# Patient Record
Sex: Male | Born: 1955 | Race: Black or African American | Hispanic: No | Marital: Married
Health system: Southern US, Community
[De-identification: ages and names within clinical notes are randomized; demographics above are authoritative.]

## PROBLEM LIST (undated history)

## (undated) ENCOUNTER — Emergency Department (HOSPITAL_COMMUNITY)

## (undated) DIAGNOSIS — J449 Chronic obstructive pulmonary disease, unspecified: Secondary | ICD-10-CM

---

## 1999-03-17 ENCOUNTER — Emergency Department (HOSPITAL_COMMUNITY): Admission: EM | Admit: 1999-03-17 | Discharge: 1999-03-17 | Payer: Self-pay

## 2002-03-01 ENCOUNTER — Encounter: Payer: Self-pay | Admitting: Emergency Medicine

## 2002-03-01 ENCOUNTER — Emergency Department (HOSPITAL_COMMUNITY): Admission: EM | Admit: 2002-03-01 | Discharge: 2002-03-01 | Payer: Self-pay | Admitting: Emergency Medicine

## 2002-03-10 ENCOUNTER — Encounter: Payer: Self-pay | Admitting: Surgery

## 2002-03-12 ENCOUNTER — Encounter: Payer: Self-pay | Admitting: Surgery

## 2002-03-12 ENCOUNTER — Inpatient Hospital Stay (HOSPITAL_COMMUNITY): Admission: RE | Admit: 2002-03-12 | Discharge: 2002-03-15 | Payer: Self-pay | Admitting: Surgery

## 2002-03-13 ENCOUNTER — Encounter: Payer: Self-pay | Admitting: Surgery

## 2002-03-14 ENCOUNTER — Encounter: Payer: Self-pay | Admitting: Surgery

## 2002-04-08 ENCOUNTER — Encounter: Admission: RE | Admit: 2002-04-08 | Discharge: 2002-04-08 | Payer: Self-pay | Admitting: Surgery

## 2002-04-08 ENCOUNTER — Encounter: Payer: Self-pay | Admitting: Surgery

## 2002-09-08 ENCOUNTER — Encounter: Payer: Self-pay | Admitting: Surgery

## 2002-09-08 ENCOUNTER — Encounter: Admission: RE | Admit: 2002-09-08 | Discharge: 2002-09-08 | Payer: Self-pay | Admitting: Surgery

## 2003-12-17 ENCOUNTER — Ambulatory Visit (HOSPITAL_COMMUNITY): Admission: RE | Admit: 2003-12-17 | Discharge: 2003-12-17 | Payer: Self-pay | Admitting: Neurology

## 2005-02-14 ENCOUNTER — Encounter: Admission: RE | Admit: 2005-02-14 | Discharge: 2005-02-14 | Payer: Self-pay | Admitting: Surgery

## 2005-11-30 IMAGING — CT CT CHEST W/ CM
1 of 2 series · 14 of 30 positions shown, 18 images · IV contrast (75 ML OMNI 300)
Comparison: CT chest 12/17/2003 and 03/01/2002.

CLINICAL DATA: History of thymoma, resected in 9998. Routine followup.

CT CHEST WITH CONTRAST  02/14/2005:
TECHNIQUE: Multidetector CT imaging of the chest and upper abdomen was
performed during bolus administration of intravenous contrast.
Contrast:  75 cc Omnipaque 300.

[Series 2: — · axial · 0.70mm/px · z∈[-302,-36]mm · 14 of 63 slices shown, 18 images]
[im 5/63  mediastinal]
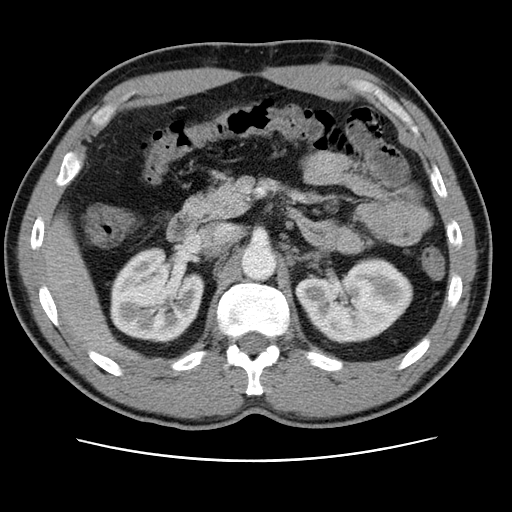
[im 5/63  lung]
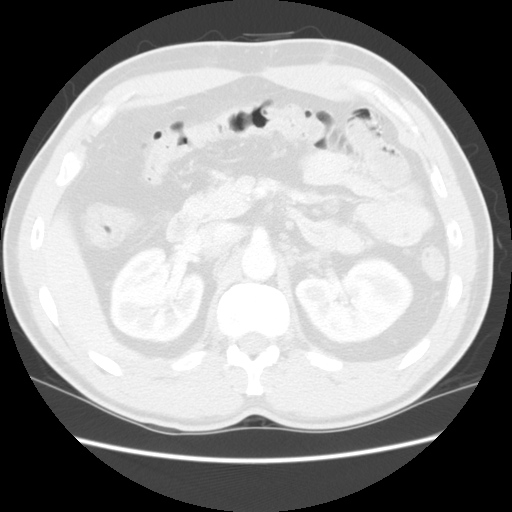
[im 9/63  lung]
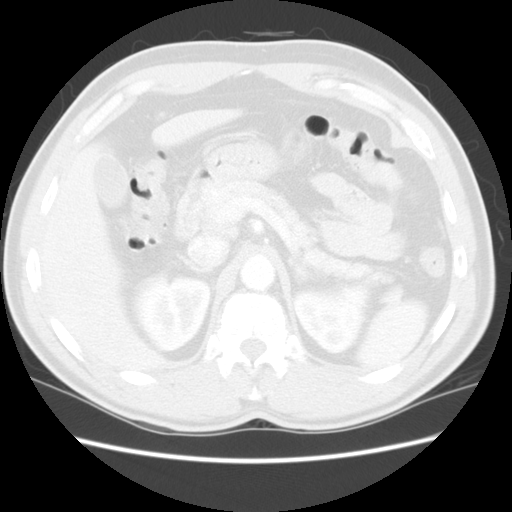
[im 14/63  lung]
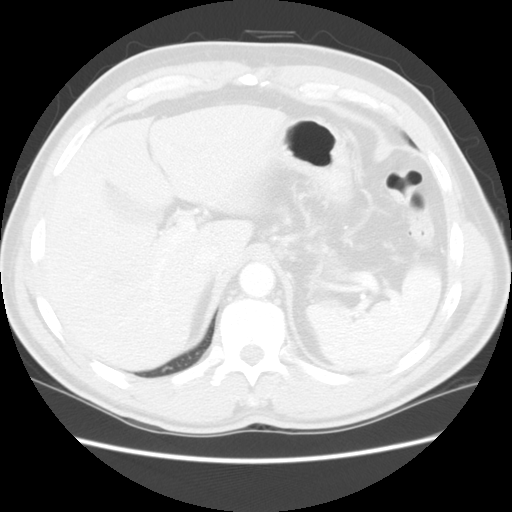
[im 18/63  lung]
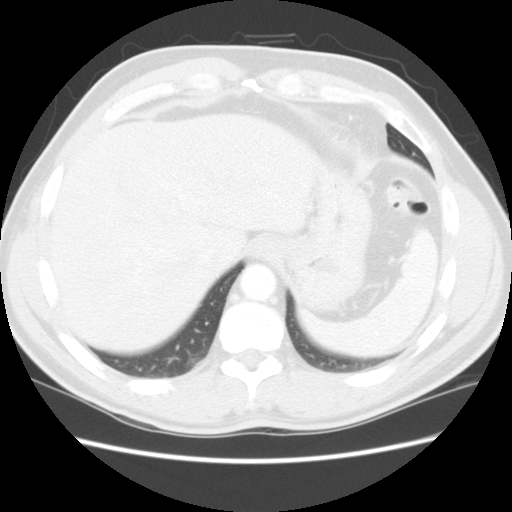
[im 23/63  mediastinal]
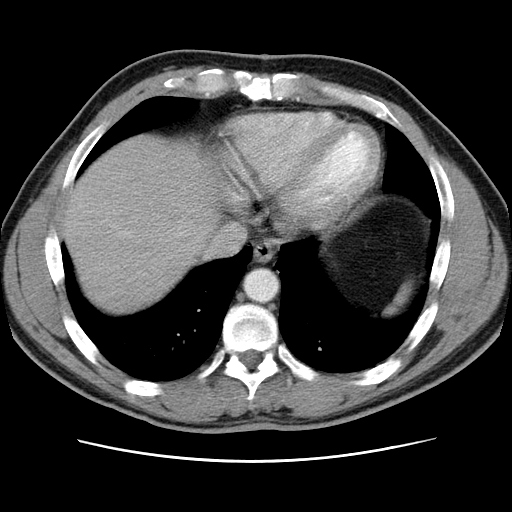
[im 23/63  lung]
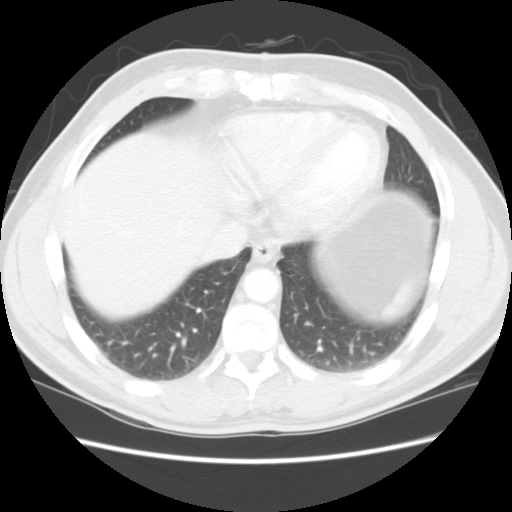
[im 27/63  lung]
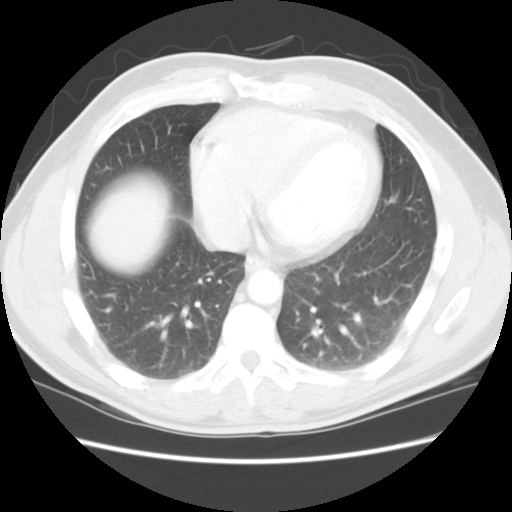
[im 30/63  lung]
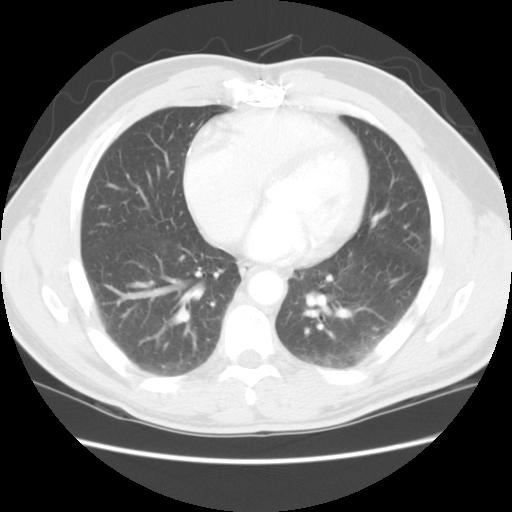
[im 32/63  lung]
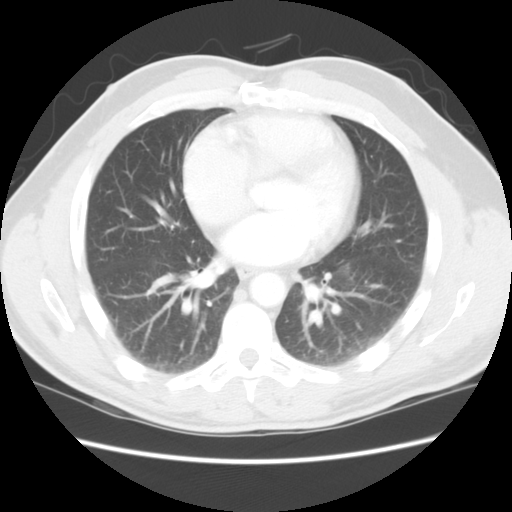
[im 36/63  mediastinal]
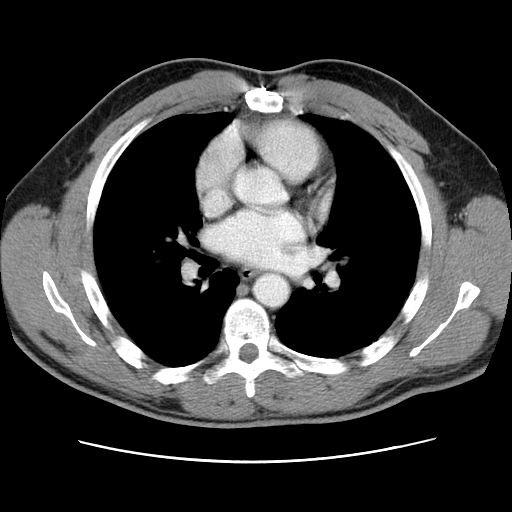
[im 36/63  lung]
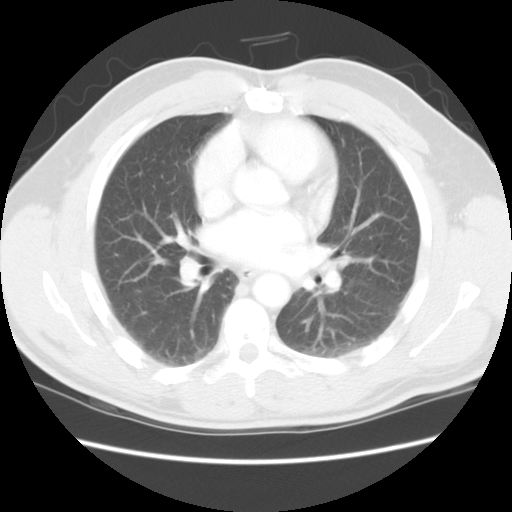
[im 40/63  lung]
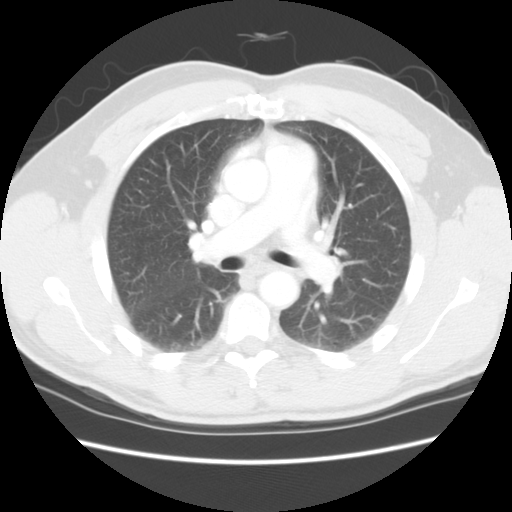
[im 45/63  lung]
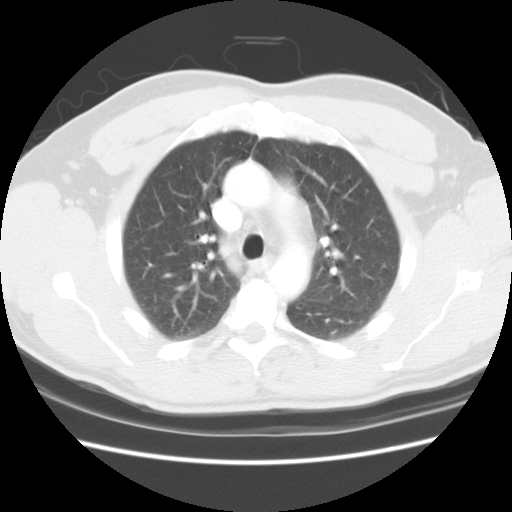
[im 49/63  lung]
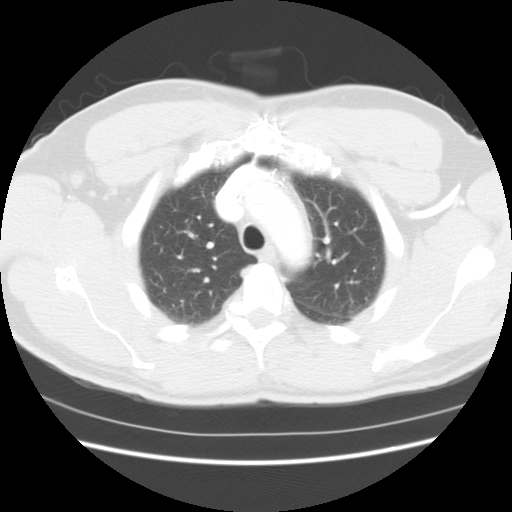
[im 54/63  mediastinal]
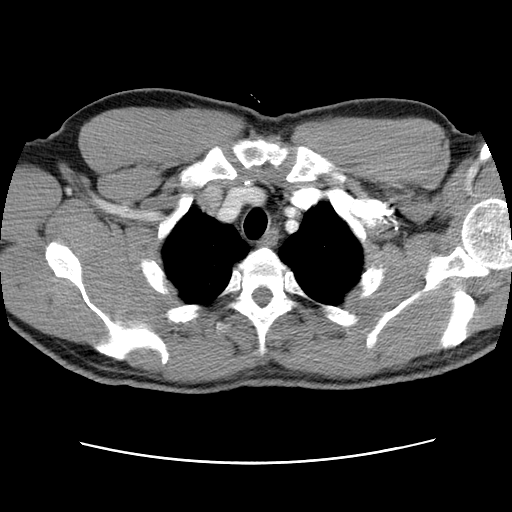
[im 54/63  lung]
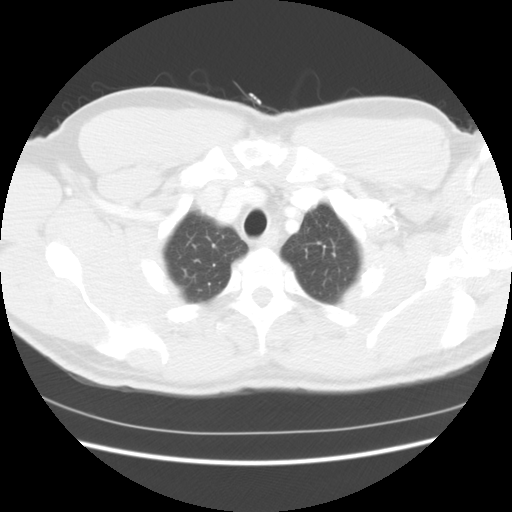
[im 58/63  lung]
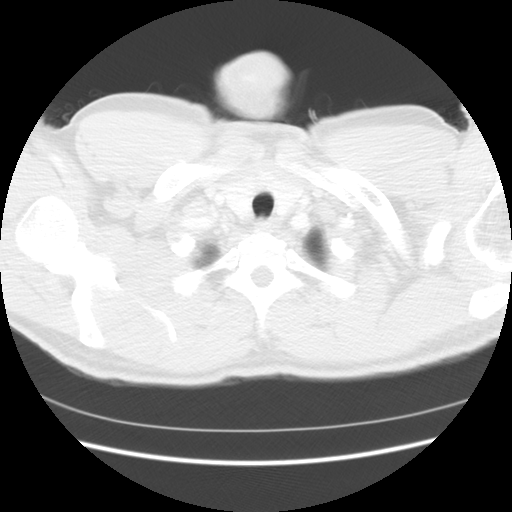

[14 of 30 positions shown; findings below may reference images not displayed]

FINDINGS: There is no evidence of recurrent thymoma in the anterior
mediastinum. No mediastinal masses or lymphadenopathy are identified. There is
no hilar or axillary lymphadenopathy. The heart size is normal and is stable.
There is no pericardial effusion. There are no pleural effusions.

No pulmonary parenchymal nodules are identified to suggest metastatic disease.
The pulmonary parenchyma is clear. The visualized upper abdomen is unremarkable
apart from scattered colonic diverticula. Review of bone window images shows no
evidence of metastatic disease.
IMPRESSION: 1. No evidence of recurrent thymoma or metastatic disease in the chest or upper
abdomen.

2. No acute cardiopulmonary disease.

## 2019-12-04 ENCOUNTER — Other Ambulatory Visit: Payer: Self-pay

## 2019-12-04 ENCOUNTER — Emergency Department (HOSPITAL_COMMUNITY): Payer: No Typology Code available for payment source

## 2019-12-04 ENCOUNTER — Emergency Department (HOSPITAL_COMMUNITY)
Admission: EM | Admit: 2019-12-04 | Discharge: 2019-12-04 | Disposition: A | Discharge status: Home / Self Care | Payer: No Typology Code available for payment source | Attending: Emergency Medicine | Admitting: Emergency Medicine

## 2019-12-04 ENCOUNTER — Encounter (HOSPITAL_COMMUNITY): Payer: Self-pay | Admitting: Emergency Medicine

## 2019-12-04 DIAGNOSIS — R05 Cough: Secondary | ICD-10-CM | POA: Insufficient documentation

## 2019-12-04 DIAGNOSIS — Z20822 Contact with and (suspected) exposure to covid-19: Secondary | ICD-10-CM | POA: Insufficient documentation

## 2019-12-04 DIAGNOSIS — R5383 Other fatigue: Secondary | ICD-10-CM | POA: Insufficient documentation

## 2019-12-04 DIAGNOSIS — J441 Chronic obstructive pulmonary disease with (acute) exacerbation: Secondary | ICD-10-CM | POA: Diagnosis not present

## 2019-12-04 DIAGNOSIS — R0602 Shortness of breath: Secondary | ICD-10-CM | POA: Diagnosis present

## 2019-12-04 HISTORY — DX: Chronic obstructive pulmonary disease, unspecified: J44.9

## 2019-12-04 MED ORDER — ALBUTEROL SULFATE HFA 108 (90 BASE) MCG/ACT IN AERS
4.0000 | INHALATION_SPRAY | RESPIRATORY_TRACT | Status: DC | PRN
  Administered 2019-12-04: 13:00:00 4 via RESPIRATORY_TRACT
  Filled 2019-12-04: qty 6.7

## 2019-12-04 MED ORDER — PREDNISONE 20 MG PO TABS: 60.0000 mg | ORAL_TABLET | Freq: Every day | ORAL | 0 refills | Status: DC

## 2019-12-04 NOTE — ED Provider Notes (Signed)
[facility]-EMERGENCY DEPT Provider Note   CSN: [id number] Arrival date & time: 12/04/19  1234     History Chief Complaint  Patient presents with  . Shortness of Breath    Carl Hopkins is a 64 y.o. male.  HPI Patient presents with shortness of breath and cough.  Has had for last few days.  Reportedly was seen around 6 days ago at the [place] for similar symptoms.  Was given doxycycline and cough medicines.  Has really had little relief.  History of COPD.  States he has had negative Covid test.  Including on Friday.  Has had some clear sputum production.  No fevers or chills.  Has had some mild fatigue.  No chest pain.  No known sick contacts or states he has been around people.    Past Medical History:  Diagnosis Date  . COPD (chronic obstructive pulmonary disease) (HCC)     There are no problems to display for this patient.   History reviewed. No pertinent surgical history.     No family history on file.  Social History   Tobacco Use  . Smoking status: Not on file  Substance Use Topics  . Alcohol use: Not on file  . Drug use: Not on file    Home Medications Prior to Admission medications   Medication Sig Start Date End Date Taking? Authorizing Provider  predniSONE (DELTASONE) 20 MG tablet Take 3 tablets (60 mg total) by mouth daily. 12/04/19   Benjiman Core, MD    Allergies    Patient has no known allergies.  Review of Systems   Review of Systems  Constitutional: Negative for appetite change.  HENT: Negative for congestion.   Respiratory: Positive for cough, shortness of breath and wheezing.   Gastrointestinal: Negative for abdominal pain.  Genitourinary: Negative for flank pain.  Musculoskeletal: Negative for back pain.  Skin: Negative for rash.  Neurological: Negative for weakness.  Psychiatric/Behavioral: Negative for confusion.    Physical Exam Updated Vital Signs BP 133/87   Pulse 89   Temp (!) 97.3 F (36.3 C)    Resp 18   SpO2 93%   Physical Exam Nursing note reviewed.  HENT:     Head: Normocephalic.  Cardiovascular:     Rate and Rhythm: Normal rate and regular rhythm.  Pulmonary:     Breath sounds: Wheezing present. No decreased breath sounds.     Comments: Somewhat dyspneic.  Wheezing.  Completing full sentences however Chest:     Chest wall: No tenderness.  Musculoskeletal:     Cervical back: Neck supple.     Right lower leg: No edema.     Left lower leg: No edema.  Skin:    General: Skin is warm.     Capillary Refill: Capillary refill takes less than 2 seconds.  Neurological:     Mental Status: He is alert and oriented to person, place, and time.     ED Results / Procedures / Treatments   Labs (all labs ordered are listed, but only abnormal results are displayed) Labs Reviewed  NOVEL CORONAVIRUS, NAA (HOSP ORDER, SEND-OUT TO REF LAB; TAT 18-24 HRS)    EKG None  Radiology DG Chest Portable 1 View  Result Date: 12/04/2019 CLINICAL DATA:  Should shortness of breath. Cough and chest pain EXAM: PORTABLE CHEST 1 VIEW COMPARISON:  Remote chest CT 02/14/2005 FINDINGS: Post median sternotomy. The cardiomediastinal contours are normal. Minimal streaky retrocardiac opacities pulmonary vasculature is normal. No consolidation, pleural effusion, or pneumothorax.  No acute osseous abnormalities are seen. IMPRESSION: Streaky retrocardiac opacities, favoring atelectasis. Electronically Signed   By: Keith Rake M.D.   On: 12/04/2019 14:26    Procedures Procedures (including critical care time)  Medications Ordered in ED Medications  albuterol (VENTOLIN HFA) 108 (90 Base) MCG/ACT inhaler 4 puff (4 puffs Inhalation Given 12/04/19 1329)    ED Course  I have reviewed the triage vital signs and the nursing notes.  Pertinent labs & imaging results that were available during my care of the patient were reviewed by me and considered in my medical decision making (see chart for details).     MDM Rules/Calculators/A&P                      Patient with shortness of breath.  Not hypoxic here even with ambulation.  Had recently been on doxycycline by [place] PCP.  Had not been on steroids.  Feels better after 4 puffs of albuterol.  Will treat with steroids.  Also will recheck an outpatient Covid test.  However not hypoxic and appears stable for discharge. Final Clinical Impression(s) / ED Diagnoses Final diagnoses:  COPD exacerbation ([place])    Rx / DC Orders ED Discharge Orders         Ordered    predniSONE (DELTASONE) 20 MG tablet  Daily,   Status:  Discontinued     12/04/19 1449    predniSONE (DELTASONE) 20 MG tablet  Daily     12/04/19 1451           Davonna Belling, MD 12/04/19 1526

## 2019-12-04 NOTE — ED Triage Notes (Signed)
Patient reports SOB with cough and chest pains x3 days. Hx COPD. Reports recent negative Covid test. Wheezing in triage.

## 2019-12-05 LAB — NOVEL CORONAVIRUS, NAA (HOSP ORDER, SEND-OUT TO REF LAB; TAT 18-24 HRS): SARS-CoV-2, NAA: NOT DETECTED

## 2020-09-18 IMAGING — DX DG CHEST 1V PORT
1 series · 1 of 1 positions shown · non-contrast
Comparison: Remote chest CT 02/14/2005

CLINICAL DATA: Should shortness of breath. Cough and chest pain

EXAM:
PORTABLE CHEST 1 VIEW

[chest ap]
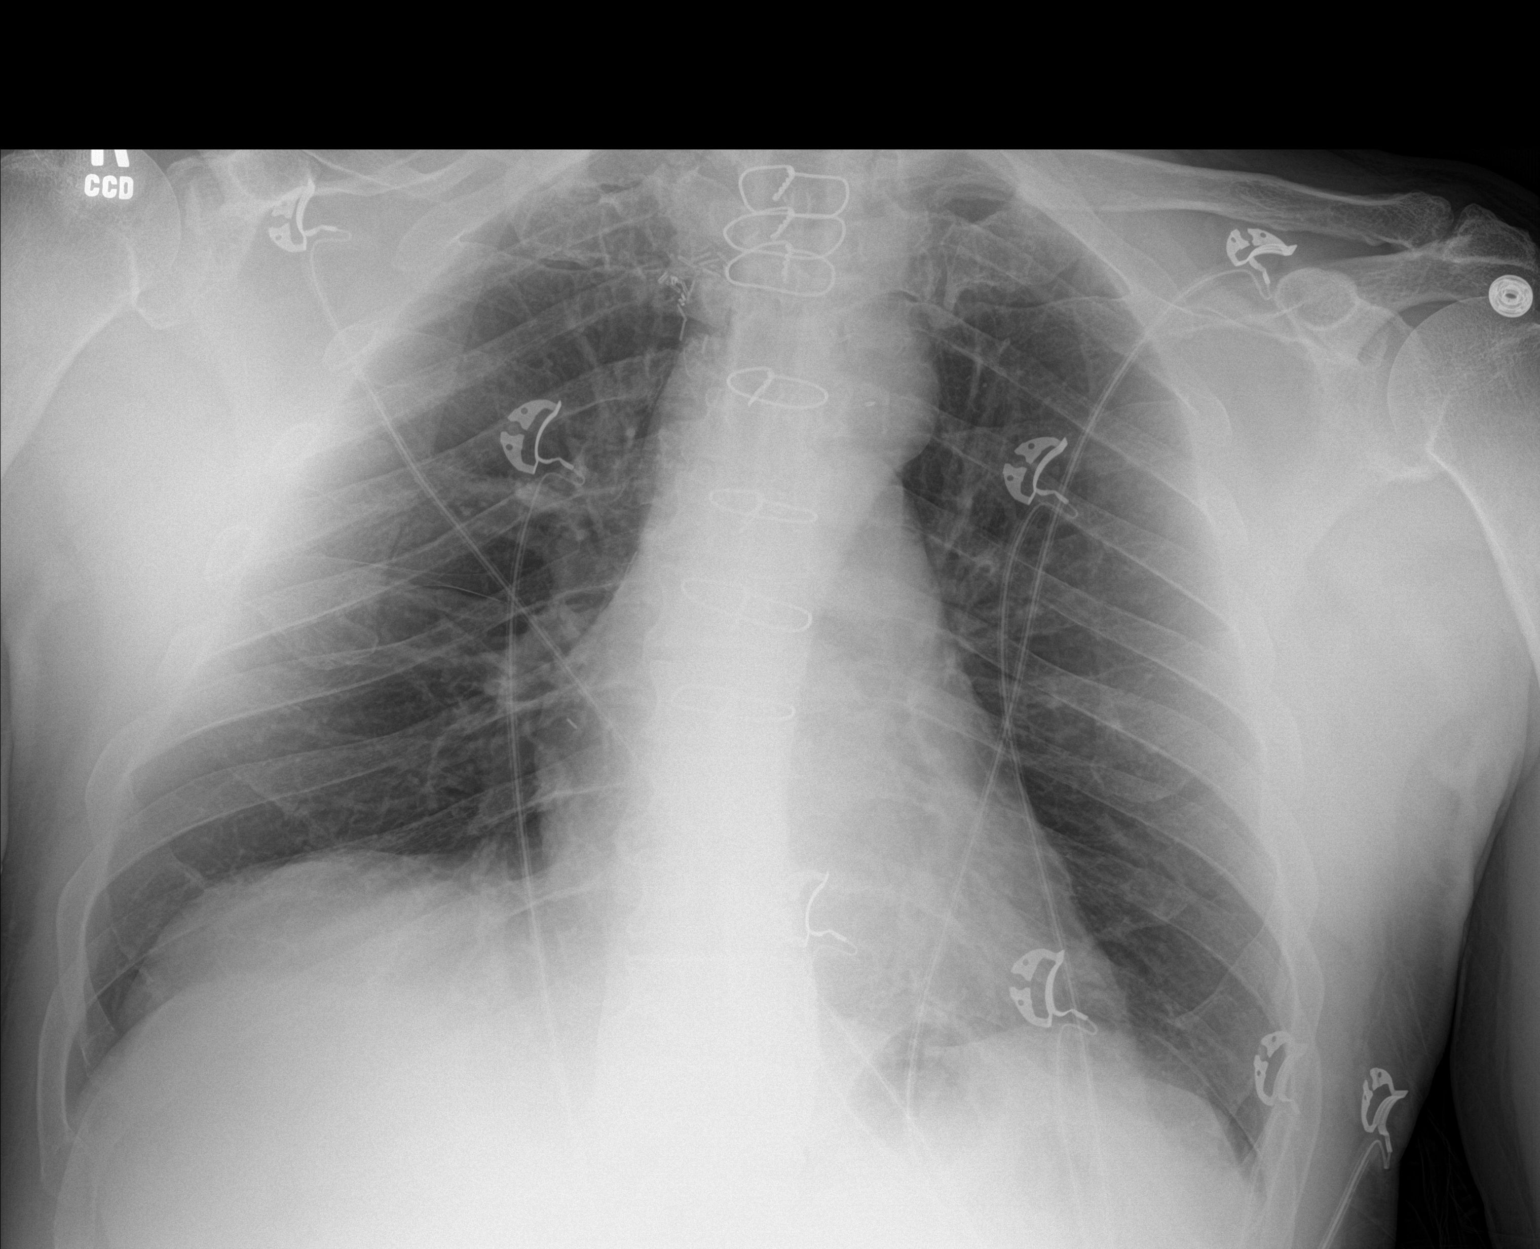

[1 of 1 positions shown; findings below may reference images not displayed]

FINDINGS: Post median sternotomy. The cardiomediastinal contours are normal.
Minimal streaky retrocardiac opacities pulmonary vasculature is
normal. No consolidation, pleural effusion, or pneumothorax. No
acute osseous abnormalities are seen.
IMPRESSION: Streaky retrocardiac opacities, favoring atelectasis.

## 2023-07-31 ENCOUNTER — Emergency Department (HOSPITAL_COMMUNITY)
Admission: EM | Admit: 2023-07-31 | Discharge: 2023-07-31 | Disposition: A | Discharge status: Home / Self Care | Payer: No Typology Code available for payment source | Attending: Emergency Medicine | Admitting: Emergency Medicine

## 2023-07-31 ENCOUNTER — Other Ambulatory Visit: Payer: Self-pay

## 2023-07-31 ENCOUNTER — Emergency Department (HOSPITAL_COMMUNITY): Payer: No Typology Code available for payment source

## 2023-07-31 DIAGNOSIS — R0602 Shortness of breath: Secondary | ICD-10-CM | POA: Diagnosis present

## 2023-07-31 DIAGNOSIS — J441 Chronic obstructive pulmonary disease with (acute) exacerbation: Secondary | ICD-10-CM | POA: Insufficient documentation

## 2023-07-31 DIAGNOSIS — Z7951 Long term (current) use of inhaled steroids: Secondary | ICD-10-CM | POA: Insufficient documentation

## 2023-07-31 DIAGNOSIS — Z7952 Long term (current) use of systemic steroids: Secondary | ICD-10-CM | POA: Diagnosis not present

## 2023-07-31 LAB — CBC
HCT: 48.3 % (ref 39.0–52.0)
Hemoglobin: 16 g/dL (ref 13.0–17.0)
MCH: 31.2 pg (ref 26.0–34.0)
MCHC: 33.1 g/dL (ref 30.0–36.0)
MCV: 94.2 fL (ref 80.0–100.0)
Platelets: 314 10*3/uL (ref 150–400)
RBC: 5.13 MIL/uL (ref 4.22–5.81)
RDW: 14.3 % (ref 11.5–15.5)
WBC: 5.7 10*3/uL (ref 4.0–10.5)
nRBC: 0 % (ref 0.0–0.2)

## 2023-07-31 LAB — BASIC METABOLIC PANEL
Anion gap: 16 — ABNORMAL HIGH (ref 5–15)
BUN: 11 mg/dL (ref 8–23)
CO2: 25 mmol/L (ref 22–32)
Calcium: 9.1 mg/dL (ref 8.9–10.3)
Chloride: 101 mmol/L (ref 98–111)
Creatinine, Ser: 0.94 mg/dL (ref 0.61–1.24)
GFR, Estimated: >60 mL/min (ref >60)
Glucose, Bld: 103 mg/dL — ABNORMAL HIGH (ref 70–99)
Potassium: 3.8 mmol/L (ref 3.5–5.1)
Sodium: 142 mmol/L (ref 135–145)

## 2023-07-31 LAB — TROPONIN I (HIGH SENSITIVITY)
Troponin I (High Sensitivity): 17 ng/L (ref <18)
Troponin I (High Sensitivity): 16 ng/L (ref <18)

## 2023-07-31 MED ORDER — PREDNISONE 10 MG PO TABS: ORAL_TABLET | ORAL | 0 refills | Status: AC

## 2023-07-31 MED ORDER — METHYLPREDNISOLONE SODIUM SUCC 125 MG IJ SOLR
125.0000 mg | Freq: Once | INTRAMUSCULAR | Status: AC
  Administered 2023-07-31: 125 mg via INTRAVENOUS
  Filled 2023-07-31: qty 2

## 2023-07-31 MED ORDER — ALBUTEROL SULFATE HFA 108 (90 BASE) MCG/ACT IN AERS: 1.0000 | INHALATION_SPRAY | Freq: Four times a day (QID) | RESPIRATORY_TRACT | 0 refills | Status: AC | PRN

## 2023-07-31 MED ORDER — ALBUTEROL SULFATE (2.5 MG/3ML) 0.083% IN NEBU
10.0000 mg/h | INHALATION_SOLUTION | Freq: Once | RESPIRATORY_TRACT | Status: AC
  Administered 2023-07-31: 10 mg/h via RESPIRATORY_TRACT
  Filled 2023-07-31: qty 12

## 2023-07-31 MED ORDER — IPRATROPIUM-ALBUTEROL 0.5-2.5 (3) MG/3ML IN SOLN
3.0000 mL | Freq: Once | RESPIRATORY_TRACT | Status: AC
  Administered 2023-07-31: 3 mL via RESPIRATORY_TRACT
  Filled 2023-07-31: qty 3

## 2023-07-31 NOTE — ED Triage Notes (Signed)
Patient arrives for eval of sob and CP only when coughing since last night. Productive cough x 3 days. Audible wheezing in triage.

## 2023-07-31 NOTE — Discharge Instructions (Addendum)
Use the inhaler and the steroid as prescribed.  Follow-up with your doctor to be rechecked to make sure you are improving.  Return to the ED for any worsening symptoms

## 2023-07-31 NOTE — ED Provider Notes (Signed)
[place] EMERGENCY DEPARTMENT AT [facility] Provider Note   CSN: [id number] Arrival date & time: 07/31/23  1053     History  Chief Complaint  Patient presents with   Shortness of Breath   Chest Pain    Carl Hopkins is a 67 y.o. male.   Shortness of Breath Associated symptoms: chest pain   Chest Pain Associated symptoms: shortness of breath      Patient has a history of COPD.  He presents to the ED with complaints of shortness of breath chest congestion.  Patient states he has been having some cough and congestion ongoing for over a month.  However in the last few days he has had a more productive cough.  He has been feeling short of breath with activity.  He has had some chest discomfort and pain when coughing.  Those symptoms started last night.  Patient has noticed increasing wheezing recently.  He has not had any fevers.  No leg swelling.  Home Medications Prior to Admission medications   Medication Sig Start Date End Date Taking? Authorizing Provider  albuterol (VENTOLIN HFA) 108 (90 Base) MCG/ACT inhaler Inhale 1-2 puffs into the lungs every 6 (six) hours as needed for wheezing or shortness of breath. 07/31/23  Yes Linwood Dibbles, MD  escitalopram (LEXAPRO) 10 MG tablet Take 5 mg by mouth daily. 05/31/23  Yes [provider]  fluticasone (FLONASE) 50 MCG/ACT nasal spray Place 1 spray into both nostrils daily. 06/19/23  Yes [provider]  fluticasone-salmeterol (WIXELA INHUB) 250-50 MCG/ACT AEPB Inhale 1 puff into the lungs in the morning and at bedtime.   Yes [provider]  losartan (COZAAR) 25 MG tablet Take 25 mg by mouth daily.   Yes [provider]  montelukast (SINGULAIR) 10 MG tablet Take 1 tablet by mouth daily. 03/31/22  Yes [provider]  omeprazole (PRILOSEC) 40 MG capsule Take 40 mg by mouth daily. 07/04/22  Yes [provider]  prazosin (MINIPRESS) 1 MG capsule Take 1 mg by mouth at  bedtime. 05/31/23  Yes [provider]  predniSONE (DELTASONE) 10 MG tablet Take 6 tablets (60 mg total) by mouth daily with breakfast for 2 days, THEN 5 tablets (50 mg total) daily with breakfast for 2 days, THEN 4 tablets (40 mg total) daily with breakfast for 2 days, THEN 3 tablets (30 mg total) daily with breakfast for 2 days, THEN 2 tablets (20 mg total) daily with breakfast for 2 days, THEN 1 tablet (10 mg total) daily with breakfast for 2 days. 07/31/23 08/11/23 Yes Linwood Dibbles, MD      Allergies    Patient has no known allergies.    Review of Systems   Review of Systems  Respiratory:  Positive for shortness of breath.   Cardiovascular:  Positive for chest pain.    Physical Exam Updated Vital Signs BP (!) 137/90   Pulse 93   Temp 97.9 F (36.6 C) (Oral)   Resp (!) 21   Ht 1.715 m (5' 7.5")   Wt 89.8 kg   SpO2 96%   BMI 30.55 kg/m  Physical Exam Vitals and nursing note reviewed.  Constitutional:      Appearance: He is well-developed. He is not ill-appearing.  HENT:     Head: Normocephalic and atraumatic.     Right Ear: External ear normal.     Left Ear: External ear normal.  Eyes:     General: No scleral icterus.  Right eye: No discharge.        Left eye: No discharge.     Conjunctiva/sclera: Conjunctivae normal.  Neck:     Trachea: No tracheal deviation.  Cardiovascular:     Rate and Rhythm: Normal rate and regular rhythm.  Pulmonary:     Effort: Pulmonary effort is normal. No respiratory distress.     Breath sounds: No stridor. Wheezing present. No rales.  Abdominal:     General: Bowel sounds are normal. There is no distension.     Palpations: Abdomen is soft.     Tenderness: There is no abdominal tenderness. There is no guarding or rebound.  Musculoskeletal:        General: No tenderness or deformity.     Cervical back: Neck supple.     Right lower leg: No edema.     Left lower leg: No edema.  Skin:    General: Skin is warm and dry.      Findings: No rash.  Neurological:     General: No focal deficit present.     Mental Status: He is alert.     Cranial Nerves: No cranial nerve deficit, dysarthria or facial asymmetry.     Sensory: No sensory deficit.     Motor: No abnormal muscle tone or seizure activity.     Coordination: Coordination normal.  Psychiatric:        Mood and Affect: Mood normal.     ED Results / Procedures / Treatments   Labs (all labs ordered are listed, but only abnormal results are displayed) Labs Reviewed  BASIC METABOLIC PANEL - Abnormal; Notable for the following components:      Result Value   Glucose, Bld 103 (*)    Anion gap 16 (*)    All other components within normal limits  CBC  TROPONIN I (HIGH SENSITIVITY)  TROPONIN I (HIGH SENSITIVITY)    EKG EKG Interpretation Date/Time:  Tuesday July 31 2023 10:46:34 EDT Ventricular Rate:  92 PR Interval:  144 QRS Duration:  74 QT Interval:  362 QTC Calculation: 447 R Axis:   90  Text Interpretation: Normal sinus rhythm Rightward axis Borderline ECG When compared with ECG of 04-Dec-2019 12:46, No significant change since last tracing Confirmed by Linwood Dibbles [phone]) on 07/31/2023 2:33:20 PM  Radiology DG Chest 2 View  Result Date: 07/31/2023 CLINICAL DATA:  Chest pain and shortness of breath for 1 day. Fatigue. COPD. EXAM: CHEST - 2 VIEW COMPARISON:  12/04/2019 FINDINGS: The heart size and mediastinal contours are within normal limits. Prior median sternotomy again noted. Pulmonary hyperinflation again seen, consistent with COPD. Both lungs are clear. Stable mild thoracic dextroscoliosis. IMPRESSION: COPD. No active cardiopulmonary disease. Electronically Signed   By: Danae Orleans M.D.   On: 07/31/2023 12:50    Procedures Procedures    Medications Ordered in ED Medications  ipratropium-albuterol (DUONEB) 0.5-2.5 (3) MG/3ML nebulizer solution 3 mL (3 mLs Nebulization Given 07/31/23 1119)  methylPREDNISolone sodium succinate  (SOLU-MEDROL) 125 mg/2 mL injection 125 mg (125 mg Intravenous Given 07/31/23 1514)  albuterol (PROVENTIL) (2.5 MG/3ML) 0.083% nebulizer solution (10 mg/hr Nebulization Given 07/31/23 1504)    ED Course/ Medical Decision Making/ A&P Clinical Course as of 07/31/23 1732  Tue Jul 31, 2023  1448 CBC CBC metabolic panel unremarkable.  Troponin normal [JK]  1448 DG Chest 2 View Chest x-ray shows COPD no other acute findings [JK]  1635 Serial troponins normal [JK]  1727 Patient still has some wheezing on exam but is  feeling much better.  He was able to ambulate around the ED without any oxygen desaturation [JK]    Clinical Course User Index [JK] Linwood Dibbles, MD                                 Medical Decision Making Differential diagnosis includes but not limited to CHF exacerbation pneumonia, COPD, ACS  Problems Addressed: COPD exacerbation [facility]): acute illness or injury that poses a threat to life or bodily functions  Amount and/or Complexity of Data Reviewed Labs: ordered. Decision-making details documented in ED Course. Radiology: ordered and independent interpretation performed. Decision-making details documented in ED Course.  Risk Prescription drug management.   Patient presented to the ED for evaluation of chest discomfort shortness of breath.  Patient states his symptoms were occurring primarily with coughing.  Cardiac workup in the ED reassuring.  EKG not suggestive of ischemia.  Serial troponins were normal.  Doubt cardiac etiology.  Patient's x-ray does not show pneumonia or CHF.  Patient did have significant wheezing on exam.  He was treated with albuterol treatments as well as steroids.  Patient noted significant improvement.  He still had some wheezing on exam but was feeling much better was able to ambulate without difficulty or hypoxia.  I feel attempted outpatient treatment is reasonable and patient agrees.  Warning signs precautions discussed.        Final Clinical  Impression(s) / ED Diagnoses Final diagnoses:  COPD exacerbation (HCC)    Rx / DC Orders ED Discharge Orders          Ordered    albuterol (VENTOLIN HFA) 108 (90 Base) MCG/ACT inhaler  Every 6 hours PRN        07/31/23 1729    predniSONE (DELTASONE) 10 MG tablet  Q breakfast        07/31/23 1729              Linwood Dibbles, MD 07/31/23 1732

## 2023-07-31 NOTE — ED Notes (Signed)
Patient ambulated by this RN, oxygen saturation between 92 and 93 on room air.

## 2023-07-31 NOTE — ED Notes (Signed)
Patient discharged by this RN, IV removed. Discharge instructions reviewed and patient verbalizes understanding. Ambulatory to lobby with wife at discharge.

## 2024-07-03 ENCOUNTER — Other Ambulatory Visit (HOSPITAL_COMMUNITY): Payer: Self-pay | Admitting: Internal Medicine

## 2024-07-03 DIAGNOSIS — H5461 Unqualified visual loss, right eye, normal vision left eye: Secondary | ICD-10-CM

## 2024-07-07 ENCOUNTER — Ambulatory Visit (HOSPITAL_COMMUNITY)
Admission: RE | Admit: 2024-07-07 | Discharge: 2024-07-07 | Disposition: A | Discharge status: Home / Self Care | Source: Ambulatory Visit | Attending: Internal Medicine | Admitting: Internal Medicine

## 2024-07-07 DIAGNOSIS — H5461 Unqualified visual loss, right eye, normal vision left eye: Secondary | ICD-10-CM | POA: Diagnosis present
